# Patient Record
Sex: Male | Born: 1960 | Race: White | Hispanic: No | Marital: Married | Smoking: Current some day smoker
Health system: Southern US, Community
[De-identification: ages and names within clinical notes are randomized; demographics above are authoritative.]

## PROBLEM LIST (undated history)

## (undated) DIAGNOSIS — I2510 Atherosclerotic heart disease of native coronary artery without angina pectoris: Secondary | ICD-10-CM

## (undated) DIAGNOSIS — I219 Acute myocardial infarction, unspecified: Secondary | ICD-10-CM

## (undated) DIAGNOSIS — N200 Calculus of kidney: Secondary | ICD-10-CM

## (undated) HISTORY — DX: Calculus of kidney: N20.0

## (undated) HISTORY — DX: Acute myocardial infarction, unspecified: I21.9

## (undated) HISTORY — PX: CARDIAC SURGERY: SHX584

## (undated) HISTORY — DX: Atherosclerotic heart disease of native coronary artery without angina pectoris: I25.10

## (undated) HISTORY — PX: CORONARY ANGIOPLASTY: SHX604

---

## 2006-02-03 ENCOUNTER — Inpatient Hospital Stay: Payer: Self-pay | Admitting: Internal Medicine

## 2006-02-03 ENCOUNTER — Other Ambulatory Visit: Payer: Self-pay

## 2006-02-04 ENCOUNTER — Other Ambulatory Visit: Payer: Self-pay

## 2006-03-13 ENCOUNTER — Encounter: Payer: Self-pay | Admitting: Internal Medicine

## 2006-03-16 ENCOUNTER — Encounter: Payer: Self-pay | Admitting: Internal Medicine

## 2010-01-19 ENCOUNTER — Ambulatory Visit: Payer: Self-pay | Admitting: Podiatry

## 2010-07-16 HISTORY — PX: COLONOSCOPY: SHX174

## 2011-08-21 ENCOUNTER — Ambulatory Visit: Payer: Self-pay | Admitting: Gastroenterology

## 2011-10-26 ENCOUNTER — Ambulatory Visit: Payer: Self-pay | Admitting: Podiatry

## 2011-12-27 ENCOUNTER — Encounter: Payer: Self-pay | Admitting: Podiatry

## 2012-01-14 ENCOUNTER — Encounter: Payer: Self-pay | Admitting: Podiatry

## 2012-10-30 LAB — BASIC METABOLIC PANEL
Anion Gap: 6 — ABNORMAL LOW (ref 7–16)
Calcium, Total: 8.8 mg/dL (ref 8.5–10.1)
Co2: 28 mmol/L (ref 21–32)
Creatinine: 0.78 mg/dL (ref 0.60–1.30)
EGFR (African American): >60
EGFR (Non-African Amer.): >60
Sodium: 140 mmol/L (ref 136–145)

## 2012-10-30 LAB — CBC
HCT: 41.9 % (ref 40.0–52.0)
HGB: 14.5 g/dL (ref 13.0–18.0)
MCH: 30.9 pg (ref 26.0–34.0)
MCV: 89 fL (ref 80–100)
RDW: 13.5 % (ref 11.5–14.5)
WBC: 11.8 10*3/uL — ABNORMAL HIGH (ref 3.8–10.6)

## 2012-10-31 ENCOUNTER — Inpatient Hospital Stay: Payer: Self-pay | Admitting: Internal Medicine

## 2012-10-31 DIAGNOSIS — I2510 Atherosclerotic heart disease of native coronary artery without angina pectoris: Secondary | ICD-10-CM

## 2012-10-31 DIAGNOSIS — I214 Non-ST elevation (NSTEMI) myocardial infarction: Secondary | ICD-10-CM

## 2012-10-31 LAB — CBC WITH DIFFERENTIAL/PLATELET
Basophil #: 0.1 10*3/uL (ref 0.0–0.1)
Basophil %: 0.7 %
HGB: 13.4 g/dL (ref 13.0–18.0)
Lymphocyte #: 1.6 10*3/uL (ref 1.0–3.6)
MCH: 30.3 pg (ref 26.0–34.0)
MCHC: 34.1 g/dL (ref 32.0–36.0)
Monocyte #: 0.8 x10 3/mm (ref 0.2–1.0)
Neutrophil #: 5.4 10*3/uL (ref 1.4–6.5)
Neutrophil %: 68.6 %
Platelet: 172 10*3/uL (ref 150–440)
RBC: 4.41 10*6/uL (ref 4.40–5.90)
RDW: 13.6 % (ref 11.5–14.5)
WBC: 7.9 10*3/uL (ref 3.8–10.6)

## 2012-10-31 LAB — TROPONIN I
Troponin-I: 2.3 ng/mL — ABNORMAL HIGH
Troponin-I: 20 ng/mL — ABNORMAL HIGH

## 2012-10-31 LAB — CK TOTAL AND CKMB (NOT AT ARMC): CK-MB: 59.6 ng/mL — ABNORMAL HIGH (ref 0.5–3.6)

## 2012-10-31 LAB — APTT: Activated PTT: 32 secs (ref 23.6–35.9)

## 2012-11-01 LAB — LIPID PANEL
Cholesterol: 106 mg/dL (ref 0–200)
Ldl Cholesterol, Calc: 41 mg/dL (ref 0–100)
Triglycerides: 80 mg/dL (ref 0–200)
VLDL Cholesterol, Calc: 16 mg/dL (ref 5–40)

## 2012-11-01 LAB — BASIC METABOLIC PANEL
Chloride: 107 mmol/L (ref 98–107)
Creatinine: 0.8 mg/dL (ref 0.60–1.30)
EGFR (Non-African Amer.): >60
Glucose: 106 mg/dL — ABNORMAL HIGH (ref 65–99)
Osmolality: 281 (ref 275–301)
Sodium: 141 mmol/L (ref 136–145)

## 2013-07-16 HISTORY — PX: ANKLE SURGERY: SHX546

## 2014-11-05 NOTE — Discharge Summary (Signed)
PATIENT NAME:  Shane Porter, Shane Porter MR#:  [id number] DATE OF BIRTH:  1960-10-18  DATE OF ADMISSION:  10/31/2012 DATE OF DISCHARGE:  11/01/2012  ADMITTING DIAGNOSIS: Chest pain.   DISCHARGE DIAGNOSES:  1.  Chest pain due to non-ST myocardial infarction, status post cardiac catheterization with a stent placement.  2.  Previous history of coronary artery disease.  3.  Hypercholesterolemia.  4.  Diverticulosis.  5.  Status post back surgery.  6.  Status post tonsillectomy.  7.  Status post appendectomy.   8.  Bradycardia.  CONSULTANTS: Dr. Gwen PoundsKowalski.   PERTINENT LABS AND EVALUATIONS: Admitting troponin was 0.03. Subsequent troponin went to 2.30 and then it went up 40. His BMP: Glucose was 104, BUN 19, creatinine 0.78, sodium 140, potassium 3.5, chloride 106, CO2 was 28. WBC 11.8, hemoglobin 14.5, platelet count 182. Chest x-ray showed no acute cardiopulmonary processes. His cardiac catheterization showed a 90% lesion in the first obtuse marginal, which was stented, also 100% stenosis at the site of prior stent at the first diagonal, there was a 30% stenosis in the mid LAD and first diagonal there was 100% stenosis.   HOSPITAL COURSE: Please refer to H and P done by the admitting physician. The patient is a 54 year old white male with previous history of coronary artery disease, who presented after having chest pain after mowing his yard. The patient when he initially came to the ED, his EKG was unremarkable. His cardiac enzymes were negative; however, with his history of coronary artery disease there was concern for unstable angina. He was seen by cardiology early and underwent a cardiac cath on the 18th. The cardiac catheter showed above findings. They were able to do a stenting procedure to the obtuse marginal. For the other disease, cardiology recommended aggressive medical management. At this time, the patient is doing much better. He is chest pain-free. He was noted to have bradycardia in the  hospital; therefore, at this time beta blockers not started otherwise, he is stable for discharge.   DISCHARGE MEDICATIONS: Simvastatin 40 at bedtime, aspirin 81 mg 1 tab p.o. daily, nitroglycerin p.r.n. 0.4 mg sublingually p.r.n. chest pain and Plavix 75 p.o. daily.   DIET: Low sodium, low fat, low cholesterol.   ACTIVITY: As tolerated.   FOLLOWUP; With Dr. Gwen PoundsKowalski 2 to 4 weeks. Follow up with primary M.D. in 1 to 2 weeks.  TIME SPENT: 35 minutes spent on the discharge.  ____________________________ Lacie ScottsShreyang H. Allena KatzPatel, MD shp:aw D: 11/02/2012 08:26:04 ET T: 11/02/2012 09:12:33 ET JOB#: [id number]  cc: Taraneh Metheney H. Allena KatzPatel, MD, <Dictator> Charise CarwinSHREYANG H Matias Thurman MD ELECTRONICALLY SIGNED 11/09/2012 13:04

## 2014-11-05 NOTE — H&P (Signed)
PATIENT NAME:  Shane Porter, Shane Porter MR#:  [id number] DATE OF BIRTH:  11-20-1960  DATE OF ADMISSION:  10/31/2012  PRIMARY CARE PHYSICIAN:  Dr. Barry BrunnerGlenn Willett.   CARDIOLOGIST:  Dr. Gwen PoundsKowalski.   REFERRING PHYSICIAN:  Dr. Jene Everyobert Kinner.   CHIEF COMPLAINT:  Chest pain.   HISTORY OF PRESENT ILLNESS:  The patient is a 54 year old Caucasian male with past medical history of coronary artery disease, status post non-ST-elevation myocardial infarction in 2007, history of hypercholesterolemia.  The patient was doing well.  Lately he had seen Dr. Gwen PoundsKowalski about three weeks ago and he had nuclear stress test and told to be negative.  The patient was mowing his yard last evening and he developed mediastinal chest pain described as sharp pain, like inflated ball.  The pain was constant.  Severity was 8 on a scale of 10, lasted about 2 hours and then gradually declined in intensity.  Now it is about 1 on a scale of 10.  He had no shortness of breath.  No vomiting, but he had mild nausea.  The pain also felt in both wrists, left wrist more than the right.  The patient indicates that this is the same feeling he had it when he had his heart attack many years ago.  EMS was called and the patient was transported to the hospital.  His EKG here is unremarkable.  His first troponin was normal.  The patient was admitted for further evaluation and treatment.   REVIEW OF SYSTEMS:  CONSTITUTIONAL:  Denies any fever.  No chills.  No fatigue.  EYES:  No blurring of vision.  No double vision.  EARS, NOSE, THROAT:  No hearing impairment.  No sore throat.  No dysphagia.  CARDIOVASCULAR:  Reports chest pain as above.  No shortness of breath.  No edema.  No syncope.  No palpitations.  RESPIRATORY:  No shortness of breath.  No cough.  No sputum production.  No hemoptysis.  GASTROINTESTINAL:  No abdominal pain, no vomiting, no diarrhea.  He has mild nausea.  GENITOURINARY:  No dysuria.  No frequency of urination.  MUSCULOSKELETAL:  No  joint pain or swelling.  No muscular pain or swelling.  INTEGUMENTARY:  No skin rash.  No ulcers.  NEUROLOGY:  No focal weakness.  No seizure activity.  No headache.  PSYCHIATRY:  No anxiety.  No depression.  ENDOCRINE:  No polyuria or polydipsia.  No heat or cold intolerance.   PAST MEDICAL HISTORY:  Non-ST-myocardial infarction in 2007.  Hypercholesterolemia.  Diverticulosis.   PAST SURGICAL HISTORY:  Back surgery, tonsillectomy, appendectomy, colonoscopy in 2013 showing diverticulosis.   SOCIAL HABITS:  Nonsmoker.  He drinks alcohol only socially.  No other drug abuse.   SOCIAL HISTORY:  The patient is married, living with his wife.  He works in Regulatory affairs officerindustrial sales.   FAMILY HISTORY:  His father has bradycardia, otherwise he is healthy.  Mother is alive and she is healthy.  No family history of premature coronary artery disease.   ADMISSION MEDICATIONS:  Simvastatin 40 mg a day, aspirin 81 mg a day.   ALLERGIES:  No known drug allergies.   PHYSICAL EXAMINATION: VITAL SIGNS:  Blood pressure 136/72, respiratory rate 16, pulse was 40 per minute.  Temperature 98.2, oxygen saturation 97%.  GENERAL APPEARANCE:  Young male lying in bed in no acute distress.  HEAD AND NECK:  No pallor.  No icterus.  No cyanosis.  EARS, NOSE, THROAT:  Examination of the ear showed normal hearing, no discharge, no lesions.  Examination of the nose showed no discharge, no bleeding, no ulcers.  Examination of the oropharyngeal area showed no oral thrush, no exudates, no ulcers.   EYES:  Examination of the eyes showed normal eyelids, normal conjunctivae.  Pupils were about 6 mm, equal, round and reactive to light.  NECK:  Supple.  Trachea at midline.  No thyromegaly.  No cervical lymphadenopathy.  No masses.  HEART:  Normal S1, S2.  No S3, S4.  No murmur.  No gallop.  No carotid bruits.  The patient noted to have bradycardia. RESPIRATORY:  Normal breathing pattern without use of accessory muscles.  No rales.  No  wheezing.  ABDOMEN:  Soft without tenderness.  No hepatosplenomegaly.  No masses.  No hernias.  SKIN:  Revealed no ulcers.  No subcutaneous nodules.  MUSCULOSKELETAL:  No joint swelling.  No clubbing.  NEUROLOGIC:  Cranial nerves II through XII are intact.  No focal motor deficit.  PSYCHIATRIC:  The patient is alert, oriented x 3.  Mood and affect were normal.   LABORATORY FINDINGS:  His EKG showed sinus bradycardia at a rate of 40 per minute. T-wave inversion in lead III.  Otherwise unremarkable EKG.  Chest x-ray showed no acute cardiopulmonary abnormalities.  His blood work-up showed glucose 104, BUN 19, creatinine 0.7, sodium 140, potassium 3.5.  Troponin 0.03.  CBC showed a white count of 11,000, hemoglobin 14, hematocrit 41, platelet count 182.   ASSESSMENT: 1.  Midsternal chest pain associated with bilateral wrist pain.  The patient indicates the pain is similar to his heart attack in 2007.  2.  History of coronary artery disease, status post non-ST-elevation myocardial infarction in 2007.  3.  Hypercholesterolemia.  4.  Sinus bradycardia.   PLAN:  We will admit the patient for observation.  Follow up on cardiac enzymes.  Continue aspirin.  I will add Plavix.  I cannot add beta-blocker given his marked bradycardia.  Consult Dr. Gwen Pounds, his cardiologist, since the patient reports he had seen him three weeks ago and he had negative nuclear stress test.  I will continue simvastatin 40 mg a day.  Sublingual nitroglycerin as needed and morphine.  Follow up on troponin and also repeat EKG in the morning.   Time spent in evaluating this patient and reviewing medical records and discussion with the patient and his wife took more than 55 minutes.     ____________________________ Carney Corners. Rudene Re, MD amd:ea D: 10/31/2012 00:34:41 ET T: 10/31/2012 02:51:06 ET JOB#: [id number]  cc: Carney Corners. Rudene Re, MD, <Dictator> Zollie Scale MD ELECTRONICALLY SIGNED 10/31/2012 22:51

## 2014-11-05 NOTE — Consult Note (Signed)
PATIENT NAME:  Shane Porter, Shane Porter MR#:  [id number] DATE OF BIRTH:  1960-09-18  DATE OF CONSULTATION:  10/31/2012  REFERRING PHYSICIAN:  Krystal EatonShayiq Ahmadzia, MD CONSULTING PHYSICIAN:  Lamar BlinksBruce J. Jerzee Jerome, MD  REASON FOR CONSULTATION: Acute myocardial infarction, hypertension, hyperlipidemia and coronary artery disease.   CHIEF COMPLAINT: "I had chest pain."   HISTORY OF PRESENT ILLNESS:  This is a [age] year old male with known coronary artery disease, hypertension and hyperlipidemia on appropriate medications who has had intermittent episodes of chest discomfort recently. The patient has had an episode of chest discomfort, substernal in nature, radiating into his back associated with shortness of breath and diaphoresis when working in the yard. At that time, the patient had aspirin and had some relief. When seen in the Emergency Room, he had an EKG showing normal sinus rhythm and no evidence of acute myocardial infarction. Troponin was 2.3. The patient had relief of chest discomfort with nitroglycerine medication and now he has no further significant symptoms.   REVIEW OF SYSTEMS: The remainder is negative for vision change, ringing in the ears, hearing loss, cough, congestion, heartburn, nausea, vomiting, diarrhea, bloody stools, stomach pain, extremity pain, leg weakness, cramping of the buttocks, known blood clots, headaches, blackouts, dizzy spells, nosebleeds, congestion, trouble swallowing, frequent urination, urination at night, muscle weakness, numbness, anxiety, depression, skin lesions and skin rashes.   PAST MEDICAL HISTORY: 1.  Coronary artery disease.  2.  Hypertension.  3.  Hyperlipidemia.   FAMILY HISTORY: No family members with early onset of cardiovascular disease or hypertension.  SOCIAL HISTORY: Currently denies alcohol or tobacco use.   ALLERGIES: No known drug allergies.   CURRENT MEDICATIONS: As listed.   PHYSICAL EXAMINATION: VITAL SIGNS: Blood pressure is 110/68 bilaterally  and heart rate is 72 upright and reclining, and regular.  GENERAL: He is a well appearing male in no acute distress. HEENT: No icterus, thyromegaly, ulcers, hemorrhage or xanthelasma.  HEART:  Regular rate and rhythm with normal S1 and S2 without murmur, gallop or rub. PMI is normal size and placement. Carotid upstroke is normal without bruit. Jugular venous pressure is normal.  LUNGS: Clear to auscultation with normal respirations.  ABDOMEN: Soft and nontender without hepatosplenomegaly or masses. Abdominal aorta is normal size without bruit.  EXTREMITIES: Shows 2+ bilateral pulses in dorsal, pedal, radial and femoral arteries without lower extremity edema, cyanosis, clubbing or ulcers.  NEUROLOGIC: He is oriented to time, place and person with normal mood and affect.   ASSESSMENT: An 54 year old male with coronary artery disease, hypertension and hyperlipidemia with acute myocardial infarction needing further treatment options.   RECOMMENDATIONS: 1.  Continue medication management for further risk reduction of cardiovascular event.  2.  Serial ECG and enzymes to assess extent of myocardial infarction.  3.  Proceed to cardiac catheterization to assess coronary anatomy and further treatment thereof as necessary. The patient understands the risks and benefits of cardiac catheterization. These include the possibility of death, stroke, heart attack, infection, bleeding or blood clot. He is at low risk for conscious sedation.  ____________________________ Lamar BlinksBruce J. Laniqua Torrens, MD bjk:sb D: 10/31/2012 08:57:44 ET T: 10/31/2012 09:04:43 ET JOB#: [id number]  cc: Lamar BlinksBruce J. Fumi Guadron, MD, <Dictator> Lamar BlinksBRUCE J Hager Compston MD ELECTRONICALLY SIGNED 11/05/2012 8:12

## 2016-11-02 ENCOUNTER — Ambulatory Visit
Admission: RE | Admit: 2016-11-02 | Discharge: 2016-11-02 | Disposition: A | Discharge status: Home / Self Care | Payer: 59 | Source: Ambulatory Visit | Attending: Internal Medicine | Admitting: Internal Medicine

## 2016-11-02 ENCOUNTER — Other Ambulatory Visit: Payer: Self-pay | Admitting: Internal Medicine

## 2016-11-02 DIAGNOSIS — R1011 Right upper quadrant pain: Secondary | ICD-10-CM

## 2016-11-02 MED ORDER — TECHNETIUM TC 99M MEBROFENIN IV KIT
5.1500 | PACK | Freq: Once | INTRAVENOUS | Status: AC | PRN
  Administered 2016-11-02: 5.15 via INTRAVENOUS

## 2016-11-07 ENCOUNTER — Encounter: Payer: Self-pay | Admitting: *Deleted

## 2016-11-14 ENCOUNTER — Encounter: Payer: Self-pay | Admitting: *Deleted

## 2016-11-21 ENCOUNTER — Ambulatory Visit (INDEPENDENT_AMBULATORY_CARE_PROVIDER_SITE_OTHER): Payer: 59 | Admitting: General Surgery

## 2016-11-21 ENCOUNTER — Encounter: Payer: Self-pay | Admitting: General Surgery

## 2016-11-21 VITALS — BP 110/60 | HR 61 | Ht 77.0 in | Wt 210.0 lb

## 2016-11-21 DIAGNOSIS — R1084 Generalized abdominal pain: Secondary | ICD-10-CM | POA: Diagnosis not present

## 2016-11-21 DIAGNOSIS — R1013 Epigastric pain: Secondary | ICD-10-CM

## 2016-11-21 NOTE — Progress Notes (Signed)
Patient ID: Shane Porter, male   DOB: 1960/12/07, 56 y.o.   MRN: [id number]  Chief Complaint  Patient presents with  . Abdominal Pain    HPI Shane FERRENTINO is a 56 y.o. male here today for a evaluation of abdominal pain. The abdominal pain started around February, 2018.He states the pain is in his epigastric area. The pain last for two to three days. He get nauseas and vomiting when he has his most severe spells. Has some belching and burning pain. The pain does not radiate nor really move into the right upper quadrant. No back pain. Vomiting is uncommon. The last time he had pain was two weeks ago (November 04, 2016).  Move his bowels daily. Ultrasound  and HIDA scan done on 11/02/2016. No pain during his HIDA scan. The patient had not appreciated any improvement in his intermittent symptoms with the institution of morning Protonix, evening Zantac initiated in April 2018. The patient reports that when he has had kidney stones these would warrant a 10/10 pain scale. These intermittent episodes of epigastric pain are 8-9/10 in severity. No family history of heart diease.  Patient is a Retail banker.  HPI  Past Medical History:  Diagnosis Date  . CAD (coronary artery disease)   . Heart attack ([place]) 2007,10/2012  . Kidney stones     Past Surgical History:  Procedure Laterality Date  . ANKLE SURGERY  2015  . CARDIAC SURGERY  2007,10/2012   stents  . COLONOSCOPY  2012    History reviewed. No pertinent family history.  Social History Social History  Substance Use Topics  . Smoking status: Former Smoker    Years: 10.00  . Smokeless tobacco: Never Used  . Alcohol use Yes    No Known Allergies  Current Outpatient Prescriptions  Medication Sig Dispense Refill  . aspirin EC 81 MG tablet Take 81 mg by mouth daily.     Marland Kitchen atorvastatin (LIPITOR) 20 MG tablet Take 20 mg by mouth daily at 6 PM.     . fexofenadine (ALLEGRA) 180 MG tablet Take by mouth daily.     . isosorbide mononitrate  (IMDUR) 30 MG 24 hr tablet Take 30 mg by mouth daily.     Marland Kitchen lisinopril (PRINIVIL,ZESTRIL) 5 MG tablet Take 5 mg by mouth daily.     . Multiple Vitamin (MULTI-VITAMINS) TABS Take by mouth daily.     . ranitidine (ZANTAC) 150 MG tablet Take 150 mg by mouth 2 (two) times daily.      No current facility-administered medications for this visit.     Review of Systems Review of Systems  Constitutional: Negative.   Respiratory: Negative.   Cardiovascular: Negative.   Gastrointestinal: Positive for abdominal pain, nausea and vomiting.    Blood pressure 110/60, pulse 61, height '6\' 5"'  (1.956 m), weight 210 lb (95.3 kg).  Physical Exam Physical Exam  Constitutional: He is oriented to person, place, and time. He appears well-developed and well-nourished.  Eyes: Conjunctivae are normal. No scleral icterus.  Neck: Neck supple.  Cardiovascular: Normal rate, regular rhythm and normal heart sounds.   Pulmonary/Chest: Effort normal and breath sounds normal.  Abdominal: Soft. Normal appearance and bowel sounds are normal. There is no hepatomegaly. There is no tenderness. No hernia.  Lymphadenopathy:    He has no cervical adenopathy.  Neurological: He is alert and oriented to person, place, and time.  Skin: Skin is warm and dry.    Data Reviewed Abdominal ultrasound dated 11/02/2016 reviewed. No evidence of  gallstones. Normal common bile duct. Normal gallbladder wall thickening.  HIDA scan with stimulation dated 11/02/2016 reviewed. Patent cystic duct, low ejection fraction 11%. No reproduction of symptoms with Ensure ingestion. CBC dated 08/30/2016 obtained when the patient presented to the [facility] emergency room was notable for white blood cell count 11,900, hemoglobin 16.5. MCV 89. Platelet count 242,000 Left shift with 86% polys.  Comprehensive metabolic panel of the same date was unremarkable. Estimated GFR greater than 60. Creatinine 0.9. Normal electrolytes. Normal liver function studies.  Lipase: 32.  Chest x-ray based bar obtained at [facility] dated 10/29/2006 showed no acute changes.  ECG dated 09/28/2016 showed sinus bradycardia with marked sinus arrhythmia, otherwise normal. Sinus arrhythmia had been replaced sinus rhythm from March 2017.  Assessment    Atypical abdominal pain, no clear biliary source.    Plan    Prior to consideration for elective cholecystectomy for the low ejection fraction, I recommended a CT scan be obtained of the abdomen and pelvis. The patient denies any new cardiac symptoms to suggest a need for reassessment there at this time. If the CT scan is negative, we'll discuss whether the potential benefits from cholecystectomy with the idea of a calculus cholecystitis warrants surgical intervention.     HPI, Physical Exam, Assessment and Plan have been scribed under the direction and in the presence of Hervey Ard, MD.  Gaspar Cola, CMA  I have completed the exam and reviewed the above documentation for accuracy and completeness.  I agree with the above.  Haematologist has been used and any errors in dictation or transcription are unintentional.  Hervey Ard, M.D., F.A.C.S.   Robert Bellow 11/22/2016, 10:07 AM   Patient has been scheduled for a CT abdomen/pelvis with contrast at [place] for 11-27-16 at 4 pm (arrive 3:45 pm). Prep: NPO 4 hours prior and pick up prep kit. Patient verbalizes understanding.

## 2016-11-21 NOTE — Patient Instructions (Signed)
Patient to have a ct scan done.   

## 2016-11-22 DIAGNOSIS — R1013 Epigastric pain: Secondary | ICD-10-CM | POA: Insufficient documentation

## 2016-11-26 ENCOUNTER — Telehealth: Payer: Self-pay | Admitting: General Surgery

## 2016-11-26 NOTE — Telephone Encounter (Signed)
PLEASE SIGN ABD/PELVIC CT  ORDER SCHEDULED FOR 11-27-16.THANK YOU.

## 2016-11-27 ENCOUNTER — Ambulatory Visit
Admission: RE | Admit: 2016-11-27 | Discharge: 2016-11-27 | Disposition: A | Discharge status: Home / Self Care | Payer: 59 | Source: Ambulatory Visit | Attending: General Surgery | Admitting: General Surgery

## 2016-11-27 DIAGNOSIS — K760 Fatty (change of) liver, not elsewhere classified: Secondary | ICD-10-CM | POA: Diagnosis not present

## 2016-11-27 DIAGNOSIS — N400 Enlarged prostate without lower urinary tract symptoms: Secondary | ICD-10-CM | POA: Diagnosis not present

## 2016-11-27 DIAGNOSIS — K5730 Diverticulosis of large intestine without perforation or abscess without bleeding: Secondary | ICD-10-CM | POA: Insufficient documentation

## 2016-11-27 DIAGNOSIS — R1084 Generalized abdominal pain: Secondary | ICD-10-CM

## 2016-11-27 MED ORDER — IOPAMIDOL (ISOVUE-300) INJECTION 61%
100.0000 mL | Freq: Once | INTRAVENOUS | Status: AC | PRN
  Administered 2016-11-27: 100 mL via INTRAVENOUS

## 2017-04-25 ENCOUNTER — Ambulatory Visit (INDEPENDENT_AMBULATORY_CARE_PROVIDER_SITE_OTHER): Payer: 59 | Admitting: General Surgery

## 2017-04-25 ENCOUNTER — Encounter: Payer: Self-pay | Admitting: General Surgery

## 2017-04-25 VITALS — BP 144/80 | HR 63 | Resp 12 | Ht 77.0 in | Wt 206.0 lb

## 2017-04-25 DIAGNOSIS — R110 Nausea: Secondary | ICD-10-CM | POA: Insufficient documentation

## 2017-04-25 DIAGNOSIS — R1084 Generalized abdominal pain: Secondary | ICD-10-CM | POA: Diagnosis not present

## 2017-04-25 DIAGNOSIS — R1013 Epigastric pain: Secondary | ICD-10-CM

## 2017-04-25 NOTE — Progress Notes (Signed)
Patient ID: Shane Porter, male   DOB: 1961-06-25, 56 y.o.   MRN: [id number]  Chief Complaint  Patient presents with  . Abdominal Pain    HPI Shane Porter is a 56 y.o. male here today for a  re evaluation of abdominal pain. Patient states started around February, 2018.The upper abdominal pain was described as a "burning" and did seem to be related with particular foods as noted below.  .Markedly improved by June, but was intermittently present from June through September.  Mid September he noticed more nausea with belching.  He has started taking Zantac and Prilosec which has helped, but not completely relieved his symptoms over the past 3 weeks.   HIDA scanwas on 11/02/2016.  Ct scan done on 11/27/2016. No vomiting. He did a food diary, he avoids nuts, stew, milk and peanut butter. Things were better from June to September with him monitoring his diet. Weight is down 10-15 pounds from beginning of his symptoms, but only 4 pounds since his last visit here.   He is here with his wife, Shane Porter.  HPI    Past Medical History:  Diagnosis Date  . CAD (coronary artery disease)   . Heart attack ([facility]) 2007,10/2012  . Kidney stones     Past Surgical History:  Procedure Laterality Date  . ANKLE SURGERY  2015  . CARDIAC SURGERY  2007,10/2012   stents/[facility]  . COLONOSCOPY  2012    No family history on file.  Social History Social History  Substance Use Topics  . Smoking status: Current Some Day Smoker    Years: 10.00    Types: Cigars  . Smokeless tobacco: Never Used  . Alcohol use Yes    No Known Allergies  Current Outpatient Prescriptions  Medication Sig Dispense Refill  . aspirin EC 81 MG tablet Take 81 mg by mouth daily.     Marland Kitchen atorvastatin (LIPITOR) 20 MG tablet Take 20 mg by mouth daily at 6 PM.     . fexofenadine (ALLEGRA) 180 MG tablet Take by mouth daily.     . isosorbide mononitrate (IMDUR) 30 MG 24 hr tablet Take 30 mg by mouth daily.     Marland Kitchen lisinopril  (PRINIVIL,ZESTRIL) 5 MG tablet Take 5 mg by mouth daily.     . Multiple Vitamin (MULTI-VITAMINS) TABS Take by mouth daily.     . ondansetron (ZOFRAN) 4 MG tablet Take 4 mg by mouth every 8 (eight) hours as needed for nausea or vomiting.    . pantoprazole (PROTONIX) 20 MG tablet Take 20 mg by mouth daily.    . ranitidine (ZANTAC) 150 MG tablet Take 150 mg by mouth 2 (two) times daily.      No current facility-administered medications for this visit.     Review of Systems Review of Systems  Constitutional: Negative.   Respiratory: Negative.   Cardiovascular: Negative.   Gastrointestinal: Positive for abdominal pain and nausea.    Blood pressure (!) 144/80, pulse 63, resp. rate 12, height  (1.956 m), weight 206 lb (93.4 kg), SpO2 98 %.  Physical Exam Physical Exam  Constitutional: He is oriented to person, place, and time. He appears well-developed and well-nourished.  HENT:  Mouth/Throat: Oropharynx is clear and moist.  Eyes: Conjunctivae are normal. No scleral icterus.  Neck: Neck supple.  Cardiovascular: Normal rate, regular rhythm and normal heart sounds.   Pulmonary/Chest: Effort normal and breath sounds normal.  Abdominal: Soft. Normal appearance and bowel sounds are normal.  Lymphadenopathy:  He has no cervical adenopathy.  Neurological: He is alert and oriented to person, place, and time.  Skin: Skin is warm and dry.  Psychiatric: His behavior is normal.    Data Reviewed 11/27/2016 CT: IMPRESSION: 1. Hepatic steatosis. 2. Mild sigmoid colon diverticula without acute inflammation 3. Prostatomegaly  11/02/2016 HIDA with CCK. IMPRESSION: 1. Normal hepatobiliary scan.  No evidence of acute cholecystitis. 2. Ejection fraction: 11%  08/30/2016 CBC was notable for white blood cell count of 11,900 (86% neutrophils) with a hemoglobin of 16.5 and a platelet count of 242,000. Normal electrolytes and renal function. Normal liver functions.  Assessment    Recurrent  upper abdominal symptoms.    Plan    The source of his symptoms is unclear. He's never undergone an upper endoscopy and I have recommended this as a screen. Anticipate this will be normal. Our next step will be to consider repeating his HIDA scan to see if he continues to have a low functioning gallbladder or proceeding directly to cholecystectomy. Pros and cons of surgical intervention were reviewed. Risks associated with biliary surgery were discussed including bleeding and injury to the common bile duct.     Opportunity for formal GI consultation for endoscopy discussed, declined.  HPI, Physical Exam, Assessment and Plan have been scribed under the direction and in the presence of Earline Mayotte, MD. Dorathy Daft, RN  I have completed the exam and reviewed the above documentation for accuracy and completeness.  I agree with the above.  Museum/gallery conservator has been used and any errors in dictation or transcription are unintentional.  Donnalee Curry, M.D., F.A.C.S.  Earline Mayotte 04/25/2017, 8:34 PM  Patient has been scheduled for an upper endoscopy on 05-14-17 at [facility]. Upper endoscopy instructions have been reviewed with the patient. This patient is aware to call the office if they have further questions. It is okay for patient to continue an 81 mg aspirin once daily.   Nicholes Mango, CMA

## 2017-04-25 NOTE — Patient Instructions (Signed)
Upper Endoscopy Upper endoscopy is a procedure to look inside the upper GI (gastrointestinal) tract. The upper GI tract is made up of:  The tube that carries food and liquid from your throat to your stomach (esophagus).  The stomach.  The first part of your small intestine (duodenum).  This procedure is also called esophagogastroduodenoscopy (EGD) or gastroscopy. In this procedure, your health care provider passes a thin, flexible tube (endoscope) through your mouth and down your esophagus into your stomach. A small camera is attached to the end of the tube. Images from the camera appear on a monitor in the exam room. During this procedure, your health care provider may also remove a small piece of tissue to be sent to a lab and examined under a microscope (biopsy). Your health care provider may do an upper endoscopy to diagnose cancers of the upper GI tract. You may also have this procedure to find the cause of other conditions, such as:  Stomach pain.  Heartburn.  Pain or problems when swallowing.  Nausea and vomiting.  Stomach bleeding.  Stomach ulcers.  Tell a health care provider about:  Any allergies you have.  All medicines you are taking, including vitamins, herbs, eye drops, creams, and over-the-counter medicines.  Any problems you or family members have had with anesthetic medicines.  Any blood disorders you have.  Any surgeries you have had.  Any medical conditions you have.  Whether you are pregnant or may be pregnant. What are the risks? Generally, this is a safe procedure. However, problems may occur, including:  Infection.  Bleeding.  Allergic reactions to medicines.  A tear or hole (perforation) in the esophagus, stomach, or duodenum.  What happens before the procedure?  Follow instructions from your health care provider about eating or drinking restrictions.  Ask your health care provider about changing or stopping your regular medicines. This is  especially important if you are taking diabetes medicines or blood thinners.  Plan to have someone take you home after the procedure.  If you go home right after the procedure, plan to have someone with you for 24 hours. What happens during the procedure?  An IV tube will be inserted into one of your veins.  Your throat may be sprayed with medicine that numbs the area (local anesthetic).  You may be given a medicine to help you relax (sedative).  You will lie on your left side.  Your health care provider will pass the endoscope through your mouth and down your esophagus.  Your provider will use the scope to check the inside of your esophagus, stomach, and duodenum. Biopsies may be taken. The procedure may vary among health care providers and hospitals. What happens after the procedure?  Do not drive for 24 hours if you received a sedative.  Your blood pressure, heart rate, breathing rate, and blood oxygen level will be monitored often until the medicines you were given have worn off.  When your throat is no longer numb, you may be given some fluids to drink.  It is your responsibility to get the results of your procedure. Ask your health care provider or the department performing the procedure when your results will be ready. This information is not intended to replace advice given to you by your health care provider. Make sure you discuss any questions you have with your health care provider. Document Released: 06/29/2000 Document Revised: 12/13/2015 Document Reviewed: 04/14/2015 Elsevier Interactive Patient Education  2017 Elsevier Inc.  

## 2017-05-14 ENCOUNTER — Ambulatory Visit: Payer: 59 | Admitting: Anesthesiology

## 2017-05-14 ENCOUNTER — Ambulatory Visit
Admission: RE | Admit: 2017-05-14 | Discharge: 2017-05-14 | Disposition: A | Discharge status: Home / Self Care | Payer: 59 | Source: Ambulatory Visit | Attending: General Surgery | Admitting: General Surgery

## 2017-05-14 ENCOUNTER — Encounter: Payer: Self-pay | Admitting: *Deleted

## 2017-05-14 ENCOUNTER — Encounter
Admission: RE | Disposition: A | Discharge status: Home / Self Care | Payer: Self-pay | Source: Ambulatory Visit | Attending: General Surgery

## 2017-05-14 DIAGNOSIS — Z87442 Personal history of urinary calculi: Secondary | ICD-10-CM | POA: Insufficient documentation

## 2017-05-14 DIAGNOSIS — I2510 Atherosclerotic heart disease of native coronary artery without angina pectoris: Secondary | ICD-10-CM | POA: Insufficient documentation

## 2017-05-14 DIAGNOSIS — I252 Old myocardial infarction: Secondary | ICD-10-CM | POA: Insufficient documentation

## 2017-05-14 DIAGNOSIS — R110 Nausea: Secondary | ICD-10-CM | POA: Insufficient documentation

## 2017-05-14 DIAGNOSIS — F17290 Nicotine dependence, other tobacco product, uncomplicated: Secondary | ICD-10-CM | POA: Insufficient documentation

## 2017-05-14 DIAGNOSIS — Z7982 Long term (current) use of aspirin: Secondary | ICD-10-CM | POA: Insufficient documentation

## 2017-05-14 DIAGNOSIS — I10 Essential (primary) hypertension: Secondary | ICD-10-CM | POA: Diagnosis not present

## 2017-05-14 DIAGNOSIS — Z79899 Other long term (current) drug therapy: Secondary | ICD-10-CM | POA: Insufficient documentation

## 2017-05-14 DIAGNOSIS — R142 Eructation: Secondary | ICD-10-CM | POA: Insufficient documentation

## 2017-05-14 DIAGNOSIS — R109 Unspecified abdominal pain: Secondary | ICD-10-CM | POA: Diagnosis not present

## 2017-05-14 DIAGNOSIS — R1013 Epigastric pain: Secondary | ICD-10-CM | POA: Insufficient documentation

## 2017-05-14 HISTORY — PX: ESOPHAGOGASTRODUODENOSCOPY (EGD) WITH PROPOFOL: SHX5813

## 2017-05-14 SURGERY — ESOPHAGOGASTRODUODENOSCOPY (EGD) WITH PROPOFOL
Anesthesia: General

## 2017-05-14 MED ORDER — LIDOCAINE HCL (CARDIAC) 20 MG/ML IV SOLN
INTRAVENOUS | Status: DC | PRN
  Administered 2017-05-14: 100 mg via INTRAVENOUS

## 2017-05-14 MED ORDER — PROPOFOL 500 MG/50ML IV EMUL
INTRAVENOUS | Status: AC
  Filled 2017-05-14: qty 50

## 2017-05-14 MED ORDER — SODIUM CHLORIDE 0.9 % IV SOLN
INTRAVENOUS | Status: DC
  Administered 2017-05-14: 13:00:00 via INTRAVENOUS

## 2017-05-14 MED ORDER — PROPOFOL 10 MG/ML IV BOLUS
INTRAVENOUS | Status: AC
  Filled 2017-05-14: qty 20

## 2017-05-14 MED ORDER — GLYCOPYRROLATE 0.2 MG/ML IJ SOLN
INTRAMUSCULAR | Status: DC | PRN
  Administered 2017-05-14: 0.2 mg via INTRAVENOUS

## 2017-05-14 MED ORDER — PROPOFOL 10 MG/ML IV BOLUS
INTRAVENOUS | Status: DC | PRN
  Administered 2017-05-14: 60 mg via INTRAVENOUS
  Administered 2017-05-14: 40 mg via INTRAVENOUS
  Administered 2017-05-14: 30 mg via INTRAVENOUS
  Administered 2017-05-14: 20 mg via INTRAVENOUS

## 2017-05-14 MED ORDER — LIDOCAINE HCL (PF) 2 % IJ SOLN
INTRAMUSCULAR | Status: AC
  Filled 2017-05-14: qty 10

## 2017-05-14 MED ORDER — PROPOFOL 500 MG/50ML IV EMUL
INTRAVENOUS | Status: DC | PRN
  Administered 2017-05-14: 150 ug/kg/min via INTRAVENOUS

## 2017-05-14 NOTE — Anesthesia Postprocedure Evaluation (Signed)
Anesthesia Post Note  Patient: Shane Porter  Procedure(s) Performed: ESOPHAGOGASTRODUODENOSCOPY (EGD) WITH PROPOFOL (N/A )  Patient location during evaluation: PACU Anesthesia Type: General Level of consciousness: awake Pain management: pain level controlled Vital Signs Assessment: post-procedure vital signs reviewed and stable Cardiovascular status: stable Anesthetic complications: no     Last Vitals:  Vitals:   05/14/17 1338 05/14/17 1358  BP:    Pulse:    Resp:  18  Temp: (!) 36 C   SpO2:      Last Pain:  Vitals:   05/14/17 1338  TempSrc: Tympanic                 VAN STAVEREN,Marigene Erler

## 2017-05-14 NOTE — Transfer of Care (Signed)
Immediate Anesthesia Transfer of Care Note  Patient: Shane Porter  Procedure(s) Performed: ESOPHAGOGASTRODUODENOSCOPY (EGD) WITH PROPOFOL (N/A )  Patient Location: PACU  Anesthesia Type:General  Level of Consciousness: drowsy  Airway & Oxygen Therapy: Patient Spontanous Breathing and Patient connected to nasal cannula oxygen  Post-op Assessment: Report given to RN and Post -op Vital signs reviewed and stable  Post vital signs: Reviewed and stable  Last Vitals:  Vitals:   05/14/17 1336 05/14/17 1338  BP: 112/77   Pulse: 70   Resp: 13   Temp:  (!) 36 C  SpO2: 98%     Last Pain:  Vitals:   05/14/17 1338  TempSrc: Tympanic         Complications: No apparent anesthesia complications

## 2017-05-14 NOTE — H&P (Signed)
No change in clinical history or exam.   For EGD. 

## 2017-05-14 NOTE — Anesthesia Procedure Notes (Signed)
Date/Time: 05/14/2017 1:06 PM Performed by: Marlana SalvageJESSUP, Shane Porter Pre-anesthesia Checklist: Patient identified, Emergency Drugs available, Suction available, Patient being monitored and Timeout performed Patient Re-evaluated:Patient Re-evaluated prior to induction Oxygen Delivery Method: Nasal cannula Induction Type: IV induction Placement Confirmation: positive ETCO2

## 2017-05-14 NOTE — Anesthesia Preprocedure Evaluation (Signed)
Anesthesia Evaluation  Patient identified by MRN, date of birth, ID band Patient awake    Reviewed: Allergy & Precautions, NPO status , Patient's Chart, lab work & pertinent test results  Airway Mallampati: III       Dental  (+) Teeth Intact   Pulmonary Current Smoker,    breath sounds clear to auscultation       Cardiovascular Exercise Tolerance: Good hypertension, + CAD, + Past MI and + Cardiac Stents   Rhythm:Regular Rate:Normal     Neuro/Psych negative psych ROS   GI/Hepatic negative GI ROS, Neg liver ROS,   Endo/Other  negative endocrine ROS  Renal/GU      Musculoskeletal negative musculoskeletal ROS (+)   Abdominal Normal abdominal exam  (+)   Peds  Hematology negative hematology ROS (+)   Anesthesia Other Findings   Reproductive/Obstetrics                             Anesthesia Physical Anesthesia Plan  ASA: II  Anesthesia Plan: General   Post-op Pain Management:    Induction: Intravenous  PONV Risk Score and Plan: 0  Airway Management Planned: Natural Airway and Nasal Cannula  Additional Equipment:   Intra-op Plan:   Post-operative Plan:   Informed Consent: I have reviewed the patients History and Physical, chart, labs and discussed the procedure including the risks, benefits and alternatives for the proposed anesthesia with the patient or authorized representative who has indicated his/her understanding and acceptance.     Plan Discussed with: CRNA  Anesthesia Plan Comments:         Anesthesia Quick Evaluation

## 2017-05-14 NOTE — Op Note (Signed)
[facility] Gastroenterology Patient Name: Shane Porter Procedure Date: 05/14/2017 1:11 PM MRN: [id number] Account #: [id number] Date of Birth: 10-05-1960 Admit Type: Outpatient Age: 56 Room: [facility] ENDO ROOM 1 Gender: Male Note Status: Finalized Procedure:            Upper GI endoscopy Indications:          Epigastric abdominal pain Providers:            Earline Mayotte, MD Referring MD:         Danella Penton, MD (Referring MD) Medicines:            Monitored Anesthesia Care Complications:        No immediate complications. Procedure:            Pre-Anesthesia Assessment:                       - Prior to the procedure, a History and Physical was                        performed, and patient medications, allergies and                        sensitivities were reviewed. The patient's tolerance of                        previous anesthesia was reviewed.                       - The risks and benefits of the procedure and the                        sedation options and risks were discussed with the                        patient. All questions were answered and informed                        consent was obtained.                       After obtaining informed consent, the endoscope was                        passed under direct vision. Throughout the procedure,                        the patient's blood pressure, pulse, and oxygen                        saturations were monitored continuously. The Endoscope                        was introduced through the mouth, and advanced to the                        second part of duodenum. The upper GI endoscopy was                        accomplished without difficulty. The patient tolerated  the procedure well. Findings:      The esophagus was normal.      The stomach was normal.      The examined duodenum was normal. Impression:           - Normal esophagus.                       - Normal  stomach.                       - Normal examined duodenum.                       - No specimens collected. Recommendation:       - Discharge patient to home (via wheelchair). Procedure Code(s):    --- Professional ---                       [id number], Esophagogastroduodenoscopy, flexible, transoral;                        diagnostic, including collection of specimen(s) by                        brushing or washing, when performed (separate procedure) Diagnosis Code(s):    --- Professional ---                       R10.13, Epigastric pain CPT copyright 2016 American Medical Association. All rights reserved. The codes documented in this report are preliminary and upon coder review may  be revised to meet current compliance requirements. Earline MayotteJeffrey W. Jaydyn Menon, MD 05/14/2017 1:34:42 PM This report has been signed electronically. Number of Addenda: 0 Note Initiated On: 05/14/2017 1:11 PM      [facility]

## 2017-05-14 NOTE — Anesthesia Post-op Follow-up Note (Signed)
Anesthesia QCDR form completed.        

## 2017-05-15 ENCOUNTER — Encounter: Payer: Self-pay | Admitting: General Surgery

## 2017-05-23 ENCOUNTER — Encounter: Payer: Self-pay | Admitting: General Surgery

## 2017-05-23 ENCOUNTER — Ambulatory Visit (INDEPENDENT_AMBULATORY_CARE_PROVIDER_SITE_OTHER): Payer: 59 | Admitting: General Surgery

## 2017-05-23 VITALS — BP 124/72 | HR 72 | Resp 12 | Ht 77.0 in | Wt 208.0 lb

## 2017-05-23 DIAGNOSIS — R1013 Epigastric pain: Secondary | ICD-10-CM

## 2017-05-23 NOTE — Patient Instructions (Addendum)
He is to decide if he wants surgery.  He will stop Zantac and start taking Protonix in the evening, may taper off if no symptoms. Monitor for patterns with symptoms.

## 2017-05-23 NOTE — Progress Notes (Signed)
Patient ID: Shane Porter, male   DOB: 1960-09-24, 56 y.o.   MRN: [id number]  Chief Complaint  Patient presents with  . Routine Post Op    HPI Shane Porter is a 56 y.o. male here today for his post op endoscopy done on 05/14/2017. Patient states he is doing well.  Abdominal ultrasound and HIDA scan( no pain during testing) was 11-02-16. CT scan was 11-27-16. He belches first thing in the morning. Nausea occurs for 2-3 at a time. HPI  Past Medical History:  Diagnosis Date  . CAD (coronary artery disease)   . Heart attack (HCC) 2007,10/2012  . Kidney stones     Past Surgical History:  Procedure Laterality Date  . ANKLE SURGERY  2015  . CARDIAC SURGERY  2007,10/2012   stents/[facility]  . COLONOSCOPY  2012  . CORONARY ANGIOPLASTY     Procedure: ESOPHAGOGASTRODUODENOSCOPY (EGD) WITH PROPOFOL;  Surgeon: Earline MayotteByrnett, Jeffrey W, MD;  Location: [facility] ENDOSCOPY;  Service: Endoscopy;  Laterality: N/A; 05-14-17  No family history on file.  Social History Social History   Tobacco Use  . Smoking status: Current Some Day Smoker    Years: 10.00    Types: Cigars  . Smokeless tobacco: Former Engineer, waterUser  Substance Use Topics  . Alcohol use: Yes  . Drug use: No    No Known Allergies  Current Outpatient Medications  Medication Sig Dispense Refill  . aspirin EC 81 MG tablet Take 81 mg by mouth daily.     Marland Kitchen. atorvastatin (LIPITOR) 20 MG tablet Take 20 mg by mouth daily at 6 PM.     . fexofenadine (ALLEGRA) 180 MG tablet Take by mouth daily.     . isosorbide mononitrate (IMDUR) 30 MG 24 hr tablet Take 30 mg by mouth daily.     Marland Kitchen. lisinopril (PRINIVIL,ZESTRIL) 5 MG tablet Take 5 mg by mouth daily.     . Multiple Vitamin (MULTI-VITAMINS) TABS Take by mouth daily.     . ondansetron (ZOFRAN) 4 MG tablet Take 4 mg by mouth every 8 (eight) hours as needed for nausea or vomiting.    . pantoprazole (PROTONIX) 20 MG tablet Take 20 mg by mouth daily.    . ranitidine (ZANTAC) 150 MG tablet Take 150 mg by  mouth 2 (two) times daily.      No current facility-administered medications for this visit.     Review of Systems Review of Systems  Constitutional: Negative.   Respiratory: Negative.   Cardiovascular: Negative.     Blood pressure 124/72, pulse 72, resp. rate 12, height 6\' 5"  (1.956 m), weight 208 lb (94.3 kg).  Physical Exam Physical Exam  Constitutional: He is oriented to person, place, and time. He appears well-developed and well-nourished.  Neurological: He is alert and oriented to person, place, and time.  Skin: Skin is warm and dry.  Psychiatric: His behavior is normal.    Data Reviewed Recently completed upper endoscopy was entirely normal.  No evidence of reflux.  HIDA scan showed an ejection fraction of 11% but did not reproduce the patient's symptoms of epigastric pain and or nausea.  Prior ultrasound and CT scan were unremarkable for a source of pain.  Assessment    Episodic abdominal pain without clear etiology.    Plan    The likelihood of relief of the patient's symptoms with cholecystectomy is probably 50/50.  Risks of surgery are small but real.  He has had 2 good weeks since his endoscopy, but reports he may go 6-12  weeks without symptoms then have several days of epigastric distress and/or nausea.  Nausea can occur before arising in the morning.  Presently he is making use of Protonix in the morning and Zantac at night.  He does not have a strong history to suggest reflux and is not noticed a significant improvement in with these medications.  Considering he is most likely to have morning symptoms (belching, nausea) we will have him take his Protonix in the evening and discontinue the Zantac.  After 2-3 weeks of daily Protonix in the evening if he remains asymptomatic he will go to an every other day dosing schedule for 2 weeks and then discontinue.  He has been asked to send a my chart message in 1 month with his progress.  Discussed options of  observation versus surgery.   HPI, Physical Exam, Assessment and Plan have been scribed under the direction and in the presence of Earline MayotteJeffrey W. Byrnett, MD. Dorathy DaftMarsha Hatch, RN  I have completed the exam and reviewed the above documentation for accuracy and completeness.  I agree with the above.  [employer] has been used and any errors in dictation or transcription are unintentional.  Donnalee CurryJeffrey Byrnett, M.D., F.A.C.S.  Earline MayotteByrnett, Jeffrey W 05/23/2017, 2:26 PM

## 2017-06-25 ENCOUNTER — Encounter: Payer: Self-pay | Admitting: General Surgery

## 2018-08-21 IMAGING — CT CT ABD-PELV W/ CM
1 of 3 series · 14 of 32 positions shown, 19 images · IV contrast (APPLIED)
Comparison: Nuclear medicine study 11/02/2016, ultrasound
11/02/2016

CLINICAL DATA: Intermittent burping with nausea vomiting and
diarrhea, mid epigastric pain

EXAM:
CT ABDOMEN AND PELVIS WITH CONTRAST
TECHNIQUE: Multidetector CT imaging of the abdomen and pelvis was performed
using the standard protocol following bolus administration of
intravenous contrast.
CONTRAST:  100mL XYUOWJ-KXX IOPAMIDOL (XYUOWJ-KXX) INJECTION 61%

[Series 2: axial st · axial · 0.82mm/px · z∈[-1042,-617]mm · 14 of 96 slices shown, 19 images]
[im 6/96  soft-tissue]
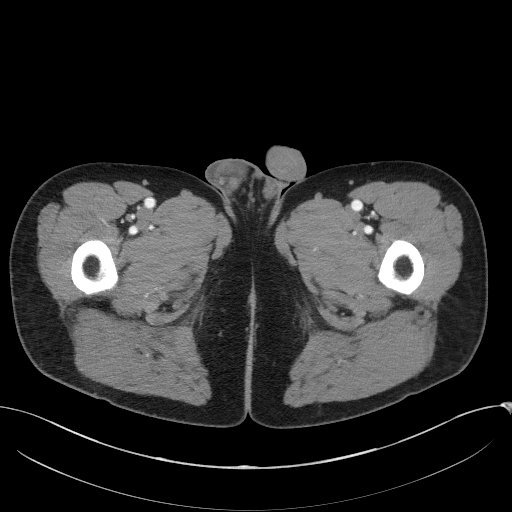
[im 6/96  bone]
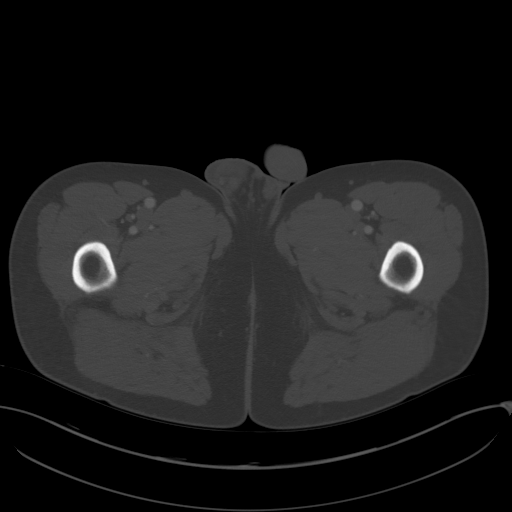
[im 16/96  soft-tissue]
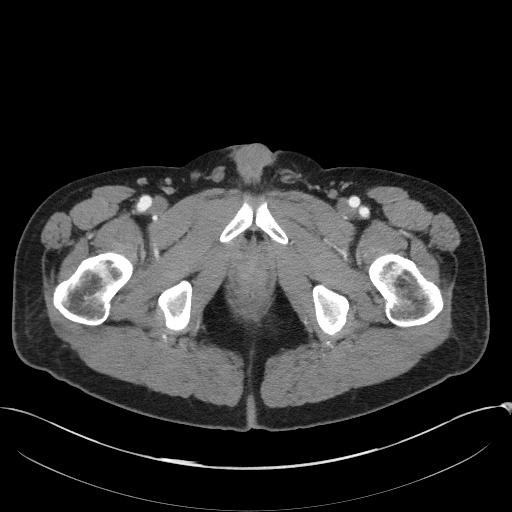
[im 21/96  soft-tissue]
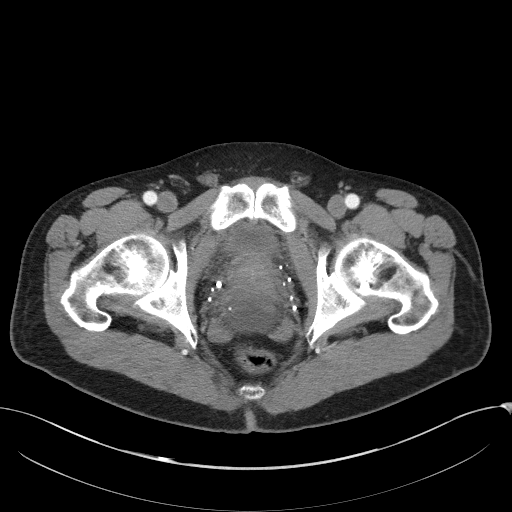
[im 26/96  soft-tissue]
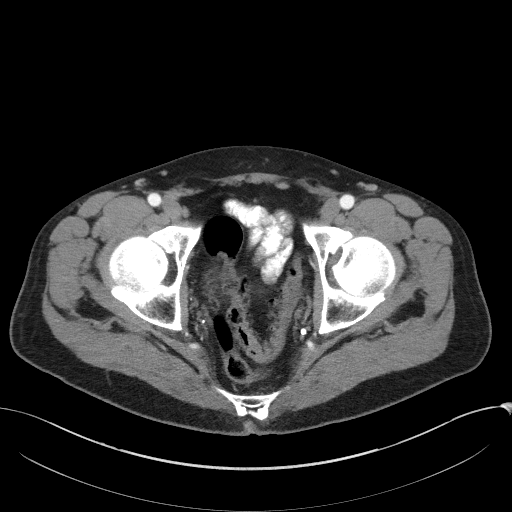
[im 36/96  soft-tissue]
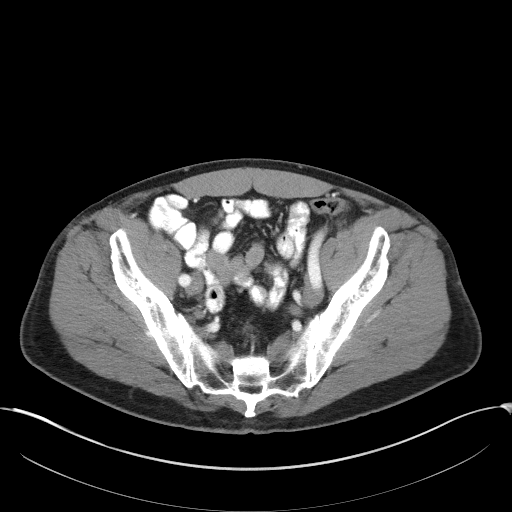
[im 41/96  soft-tissue]
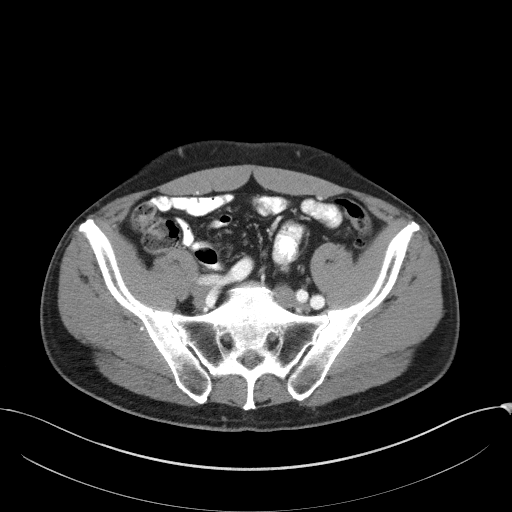
[im 51/96  soft-tissue]
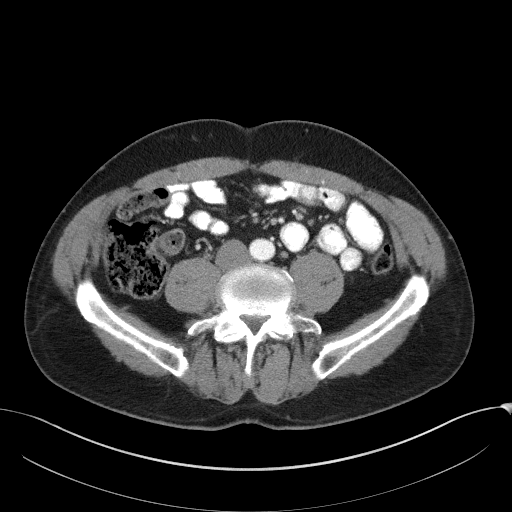
[im 56/96  soft-tissue]
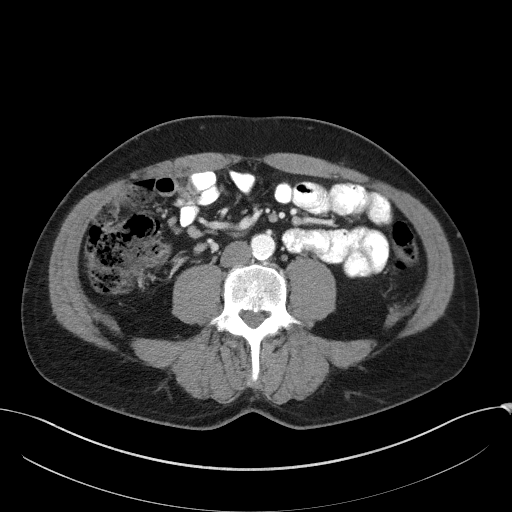
[im 61/96  soft-tissue]
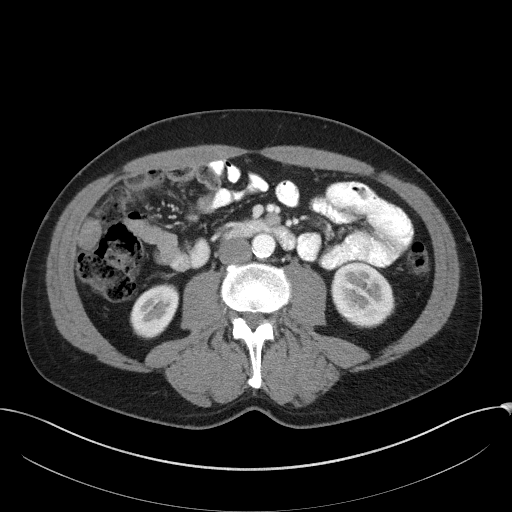
[im 61/96  bone]
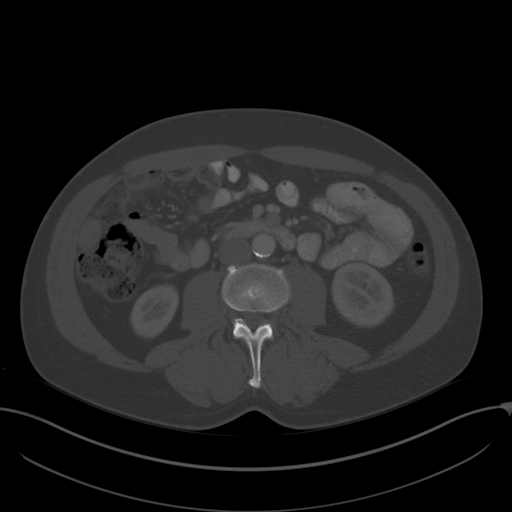
[im 71/96  soft-tissue]
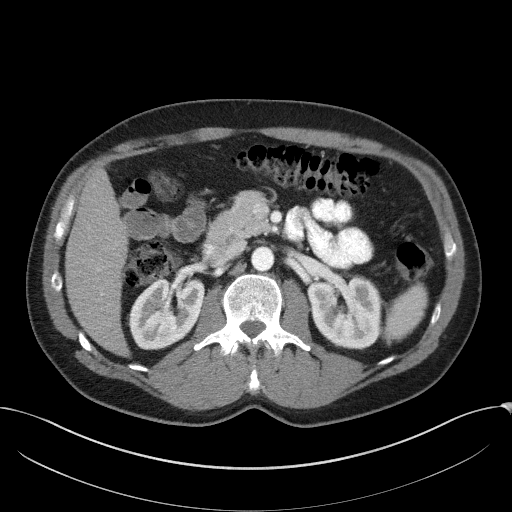
[im 76/96  soft-tissue]
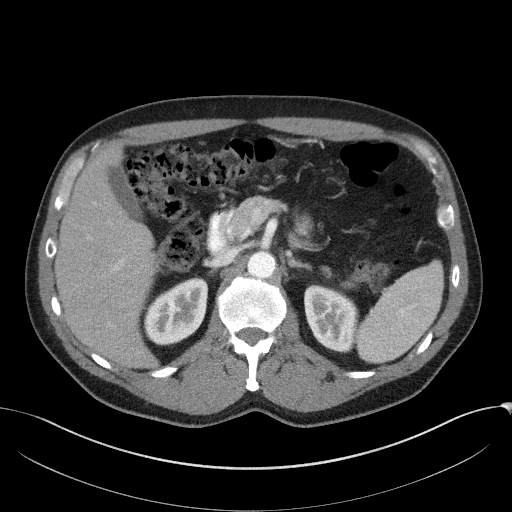
[im 76/96  lung]
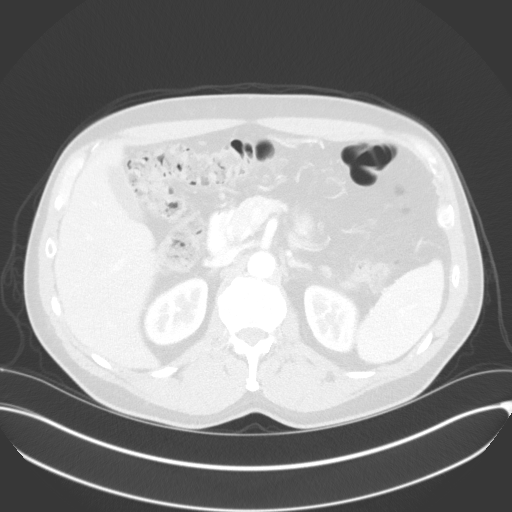
[im 81/96  soft-tissue]
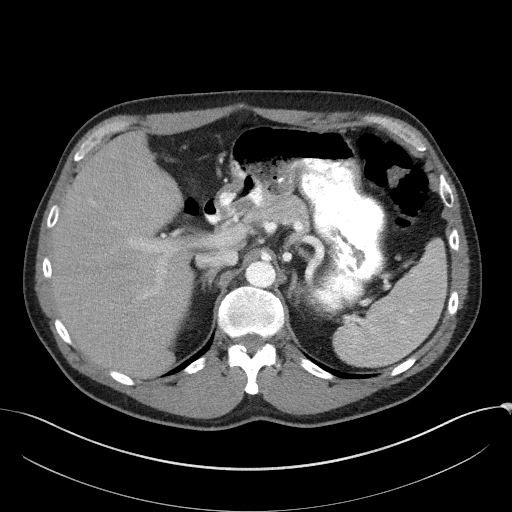
[im 81/96  lung]
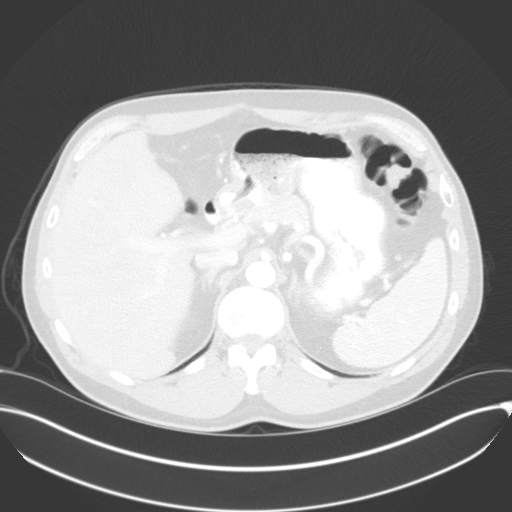
[im 86/96  lung]
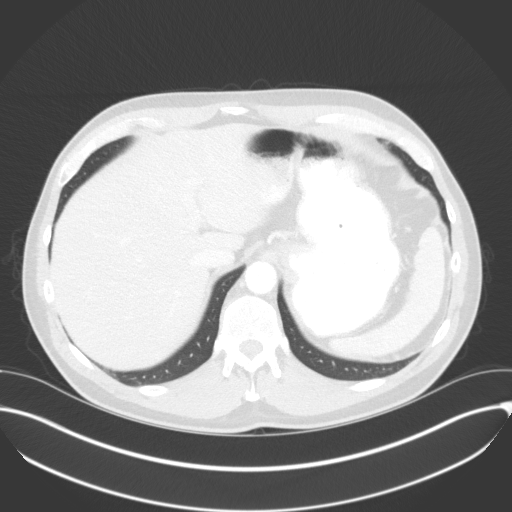
[im 91/96  soft-tissue]
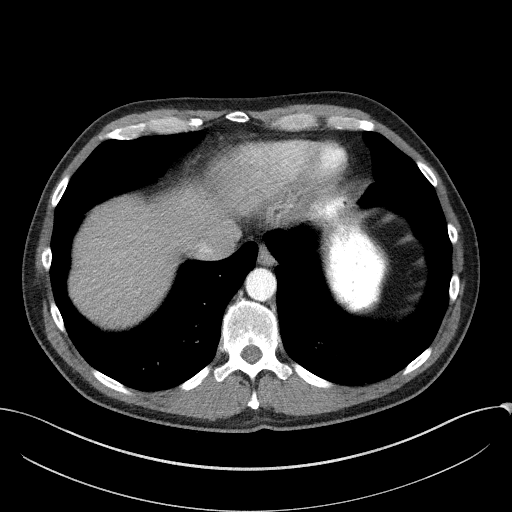
[im 91/96  lung]
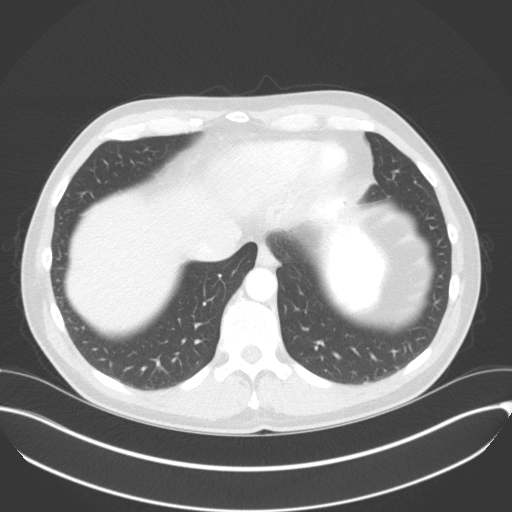

[14 of 32 positions shown; findings below may reference images not displayed]

FINDINGS: Lower chest: Lung bases demonstrate no acute consolidation or
pleural effusion. Normal heart size.

Hepatobiliary: Hepatic steatosis. No calcified gallstones. No
biliary dilatation.

Pancreas: Unremarkable. No pancreatic ductal dilatation or
surrounding inflammatory changes.

Spleen: Normal in size without focal abnormality.

Adrenals/Urinary Tract: Adrenal glands are unremarkable. Kidneys are
normal, without renal calculi, focal lesion, or hydronephrosis.
Bladder is unremarkable.

Stomach/Bowel: Stomach is within normal limits. Appendix is
nonvisualized consistent with history of appendectomy. No evidence
of bowel wall thickening, distention, or inflammatory changes. Mild
sigmoid colon diverticula.

Vascular/Lymphatic: Aortic atherosclerosis. No enlarged abdominal or
pelvic lymph nodes.

Reproductive: Enlarged prostate.

Other: Multiple calcified pelvic phleboliths. No free air or free
fluid. Small fat in the umbilicus.

Musculoskeletal: Degenerative changes. No acute or suspicious bone
lesion.
IMPRESSION: 1. Hepatic steatosis.
2. Mild sigmoid colon diverticula without acute inflammation
3. Prostatomegaly

## 2018-09-12 ENCOUNTER — Other Ambulatory Visit: Payer: Self-pay

## 2018-09-12 ENCOUNTER — Emergency Department
Admission: EM | Admit: 2018-09-12 | Discharge: 2018-09-12 | Disposition: A | Discharge status: Home / Self Care | Payer: 59 | Attending: Emergency Medicine | Admitting: Emergency Medicine

## 2018-09-12 ENCOUNTER — Encounter: Payer: Self-pay | Admitting: Intensive Care

## 2018-09-12 DIAGNOSIS — H938X1 Other specified disorders of right ear: Secondary | ICD-10-CM | POA: Diagnosis not present

## 2018-09-12 DIAGNOSIS — I2510 Atherosclerotic heart disease of native coronary artery without angina pectoris: Secondary | ICD-10-CM | POA: Insufficient documentation

## 2018-09-12 DIAGNOSIS — Z7982 Long term (current) use of aspirin: Secondary | ICD-10-CM | POA: Diagnosis not present

## 2018-09-12 DIAGNOSIS — B029 Zoster without complications: Secondary | ICD-10-CM | POA: Diagnosis not present

## 2018-09-12 DIAGNOSIS — H9191 Unspecified hearing loss, right ear: Secondary | ICD-10-CM | POA: Diagnosis present

## 2018-09-12 DIAGNOSIS — F17290 Nicotine dependence, other tobacco product, uncomplicated: Secondary | ICD-10-CM | POA: Insufficient documentation

## 2018-09-12 DIAGNOSIS — Z79899 Other long term (current) drug therapy: Secondary | ICD-10-CM | POA: Insufficient documentation

## 2018-09-12 DIAGNOSIS — B028 Zoster with other complications: Secondary | ICD-10-CM

## 2018-09-12 NOTE — Discharge Instructions (Addendum)
You were seen today for right ear fullness and decreased hearing. We discussed the possibility of Ramsey Hunt Syndrome, but I don't think this is actually what is causing your ear fullness and decreased hearing. Continue Acyclovir, Gabapentin, Tramadol and Prednisone until you have finished your course. Add Flonase (OTC) 1 spray right nostril, 2 x day. Follow up with Dr. Willeen Cass if symptoms persist or worsen.

## 2018-09-12 NOTE — ED Notes (Signed)

## 2018-09-12 NOTE — ED Provider Notes (Signed)
[facility] Emergency Department Provider Note ____________________________________________  Time seen: 78  I have reviewed the triage vital signs and the nursing notes.  HISTORY  Chief Complaint  Herpes Zoster   HPI Shane Porter is a 58 y.o. male  Presents to the ER with c/o right ear fullness and decreased hearing. He reports this started this morning. He denies ear pain, or drainage. He does have decreased hearing and wears hearing aids normally, he just feels like today his hearing is worse. He mainly came in today because he was diagnosed with shingles 2/19. He was treated with Acyclovir, Keflex and Tramadol by [facility]. He saw his PCP, Dr. Hyacinth Meeker the next day. Dr. Hyacinth Meeker added Prednisone and Gabapentin. He reports reports the rash is scabbing over and almost healed. His pain is not terribly bad. He reports Dr. Hyacinth Meeker advised him that if he started having trouble with his vision or hearing, he should be seen immediately. He denies visual changes. He denies fever. He has not tried anything OTC for this.  Past Medical History:  Diagnosis Date  . CAD (coronary artery disease)   . Heart attack (HCC) 2007,10/2012  . Kidney stones     Patient Active Problem List   Diagnosis Date Noted  . Nausea 04/25/2017  . Abdominal pain, epigastric 11/22/2016    Past Surgical History:  Procedure Laterality Date  . ANKLE SURGERY  2015  . CARDIAC SURGERY  2007,10/2012   stents/[facility]  . COLONOSCOPY  2012  . CORONARY ANGIOPLASTY    . ESOPHAGOGASTRODUODENOSCOPY (EGD) WITH PROPOFOL N/A 05/14/2017   Procedure: ESOPHAGOGASTRODUODENOSCOPY (EGD) WITH PROPOFOL;  Surgeon: Earline Mayotte, MD;  Location: [facility] ENDOSCOPY;  Service: Endoscopy;  Laterality: N/A;    Prior to Admission medications   Medication Sig Start Date End Date Taking? Authorizing Provider  aspirin EC 81 MG tablet Take 81 mg by mouth daily.     [provider]  atorvastatin (LIPITOR) 20 MG  tablet Take 20 mg by mouth daily at 6 PM.  09/18/16   [provider]  fexofenadine (ALLEGRA) 180 MG tablet Take by mouth daily.     [provider]  isosorbide mononitrate (IMDUR) 30 MG 24 hr tablet Take 30 mg by mouth daily.  10/23/16   [provider]  lisinopril (PRINIVIL,ZESTRIL) 5 MG tablet Take 5 mg by mouth daily.  11/18/15   [provider]  Multiple Vitamin (MULTI-VITAMINS) TABS Take by mouth daily.     [provider]  ondansetron (ZOFRAN) 4 MG tablet Take 4 mg by mouth every 8 (eight) hours as needed for nausea or vomiting.    [provider]  pantoprazole (PROTONIX) 20 MG tablet Take 20 mg by mouth daily.    [provider]  ranitidine (ZANTAC) 150 MG tablet Take 150 mg by mouth 2 (two) times daily.     [provider]    Allergies Patient has no known allergies.  History reviewed. No pertinent family history.  Social History Social History   Tobacco Use  . Smoking status: Current Some Day Smoker    Years: 10.00    Types: Cigars  . Smokeless tobacco: Former Engineer, water Use Topics  . Alcohol use: Yes  . Drug use: No    Review of Systems  Constitutional: Negative for fever, chills or body ahces. Eyes: Negative for visual changes. ENT: Positive for decreased hearing, ear fullness- right ear. Cardiovascular: Negative for chest pain or chest tightness. Respiratory: Negative for shortness of  breath. Skin: Positive for rash below bottom lip. Neurological: Negative for headaches, focal weakness or numbness. ____________________________________________  PHYSICAL EXAM:  VITAL SIGNS: ED Triage Vitals  Enc Vitals Group     BP 09/12/18 1800 119/75     Pulse Rate 09/12/18 1800 (!) 54     Resp 09/12/18 1800 17     Temp 09/12/18 1800 98.2 F (36.8 C)     Temp Source 09/12/18 1800 Oral     SpO2 09/12/18 1800 97 %     Weight 09/12/18 1801 215 lb (97.5 kg)     Height 09/12/18 1801 6\' 5"  (1.956 m)      Head Circumference --      Peak Flow --      Pain Score 09/12/18 1807 1     Pain Loc --      Pain Edu? --      Excl. in GC? --     Constitutional: Alert and oriented. Well appearing and in no distress. Head: Normocephalic. Eyes: Conjunctivae are normal. PERRL. Normal extraocular movements Ears: Canals clear. TMs intact bilaterally. No blisters or ulcerations noted of right ear canal. Mouth/Throat: Mucous membranes are moist. No oral blisters or ulcerations noted. Cardiovascular: Normal rate, regular rhythm.  Respiratory: Normal respiratory effort. No wheezes/rales/rhonchi. Neurologic:  Normal gait without ataxia. Normal speech and language. No gross focal neurologic deficits are appreciated. Skin: Scabbed lesions noted below right lower lip. ____________________________________________  INITIAL IMPRESSION / ASSESSMENT AND PLAN / ED COURSE  DDX include otitis media, ETD, Ramsey Hunt Syndrome No abnormality noted of the ear He will try Flonase 1 spray BID Continue Acyclovir, Prednisone, Tramadol and Gabapentin Referral to ENT for further evaluation- ok to follow up as outpatient ____________________________________________  FINAL CLINICAL IMPRESSION(S) / ED DIAGNOSES  Final diagnoses:  Ear fullness, right  Decreased hearing of right ear  Herpes zoster with other complication   Nicki Reaper, NP    Lorre Munroe, NP 09/12/18 Radonna Ricker, MD 09/12/18 [phone]

## 2018-09-12 NOTE — ED Triage Notes (Signed)
PAtient diagnosed with shingles last Wednesday at urgent care. Patient is here today for diminished hearing in R ear. Patient shingles are not completely scabbed over. Also c/o jaw pain

## 2019-10-12 ENCOUNTER — Other Ambulatory Visit: Payer: 59

## 2019-10-15 ENCOUNTER — Other Ambulatory Visit: Payer: 59

## 2019-10-19 ENCOUNTER — Ambulatory Visit: Admit: 2019-10-19 | Payer: 59 | Admitting: Specialist

## 2019-10-19 SURGERY — ARTHROSCOPY, KNEE, WITH MENISCUS REPAIR
Anesthesia: General | Site: Knee | Laterality: Right
